# Patient Record
Sex: Female | Born: 1950 | Race: Black or African American | Hispanic: No | Marital: Married | Smoking: Never smoker
Health system: Southern US, Community
[De-identification: ages and names within clinical notes are randomized; demographics above are authoritative.]

## PROBLEM LIST (undated history)

## (undated) DIAGNOSIS — I10 Essential (primary) hypertension: Secondary | ICD-10-CM

## (undated) DIAGNOSIS — H409 Unspecified glaucoma: Secondary | ICD-10-CM

## (undated) HISTORY — PX: TOTAL HIP ARTHROPLASTY: SHX124

---

## 2016-12-09 ENCOUNTER — Emergency Department (HOSPITAL_COMMUNITY): Payer: BLUE CROSS/BLUE SHIELD

## 2016-12-09 ENCOUNTER — Encounter (HOSPITAL_COMMUNITY): Payer: Self-pay | Admitting: *Deleted

## 2016-12-09 ENCOUNTER — Emergency Department (HOSPITAL_COMMUNITY)
Admission: EM | Admit: 2016-12-09 | Discharge: 2016-12-09 | Disposition: A | Discharge status: Home / Self Care | Payer: BLUE CROSS/BLUE SHIELD | Attending: Emergency Medicine | Admitting: Emergency Medicine

## 2016-12-09 DIAGNOSIS — M1712 Unilateral primary osteoarthritis, left knee: Secondary | ICD-10-CM | POA: Insufficient documentation

## 2016-12-09 DIAGNOSIS — I10 Essential (primary) hypertension: Secondary | ICD-10-CM | POA: Diagnosis not present

## 2016-12-09 DIAGNOSIS — M25562 Pain in left knee: Secondary | ICD-10-CM | POA: Diagnosis present

## 2016-12-09 HISTORY — DX: Unspecified glaucoma: H40.9

## 2016-12-09 HISTORY — DX: Essential (primary) hypertension: I10

## 2016-12-09 MED ORDER — TRAMADOL HCL 50 MG PO TABS: 50.0000 mg | ORAL_TABLET | Freq: Four times a day (QID) | ORAL | 0 refills | Status: AC | PRN

## 2016-12-09 MED ORDER — DICLOFENAC SODIUM 75 MG PO TBEC: 75.0000 mg | DELAYED_RELEASE_TABLET | Freq: Two times a day (BID) | ORAL | 0 refills | Status: AC

## 2016-12-09 MED ORDER — KETOROLAC TROMETHAMINE 60 MG/2ML IM SOLN
60.0000 mg | Freq: Once | INTRAMUSCULAR | Status: AC
  Administered 2016-12-09: 60 mg via INTRAMUSCULAR
  Filled 2016-12-09: qty 2

## 2016-12-09 NOTE — Discharge Instructions (Signed)
You do have evidence of arthritis (wear and tear) of your knee joint and knee cap which may be the source of your symptoms.  Rest, try using ice packs to the knee, wrap it for comfort.

## 2016-12-09 NOTE — ED Provider Notes (Signed)
[facility] EMERGENCY DEPARTMENT Provider Note   CSN: [id number] Arrival date & time: 12/09/16  1049     History   Chief Complaint Chief Complaint  Patient presents with  . Knee Pain    HPI Brenda Morton is a 66 y.o. female.  The history is provided by the patient and the spouse.  Knee Pain   This is a new problem. The current episode started yesterday. The problem occurs constantly. The problem has not changed since onset.The pain is present in the left knee. The pain is at a severity of 9/10. The pain is moderate. Associated symptoms include limited range of motion. Pertinent negatives include no numbness and no stiffness. The symptoms are aggravated by activity. She has tried OTC pain medications and OTC ointments for the symptoms. The treatment provided no relief. There has been no history of extremity trauma.    Past Medical History:  Diagnosis Date  . Glaucoma   . Hypertension     There are no active problems to display for this patient.   Past Surgical History:  Procedure Laterality Date  . TOTAL HIP ARTHROPLASTY Right     OB History    No data available       Home Medications    Prior to Admission medications   Medication Sig Start Date End Date Taking? Authorizing Provider  diclofenac (VOLTAREN) 75 MG EC tablet Take 1 tablet (75 mg total) by mouth 2 (two) times daily. 12/09/16   Evalee Jefferson, PA-C  traMADol (ULTRAM) 50 MG tablet Take 1 tablet (50 mg total) by mouth every 6 (six) hours as needed. 12/09/16   Evalee Jefferson, PA-C    Family History No family history on file.  Social History Social History  Substance Use Topics  . Smoking status: Never Smoker  . Smokeless tobacco: Never Used  . Alcohol use No     Allergies   Codeine   Review of Systems Review of Systems  Constitutional: Negative for fever.  Musculoskeletal: Positive for arthralgias and joint swelling. Negative for myalgias and stiffness.  Neurological: Negative for weakness and  numbness.     Physical Exam Updated Vital Signs BP 133/78   Pulse 64   Temp 98.6 F (37 C) (Oral)   Resp 18   Ht 5\' 2"  (1.575 m)   Wt 106.6 kg (235 lb)   SpO2 98%   BMI 42.98 kg/m   Physical Exam  Constitutional: She appears well-developed and well-nourished.  HENT:  Head: Atraumatic.  Neck: Normal range of motion.  Cardiovascular:  Pulses equal bilaterally  Musculoskeletal: She exhibits tenderness.       Left knee: She exhibits swelling. She exhibits no effusion, no deformity, no erythema, normal alignment, no LCL laxity and no MCL laxity. Tenderness found. Medial joint line and lateral joint line tenderness noted.  Mild crepitus with knee flexion.  Neurological: She is alert. She has normal strength. She displays normal reflexes. No sensory deficit.  Skin: Skin is warm and dry.  Psychiatric: She has a normal mood and affect.     ED Treatments / Results  Labs (all labs ordered are listed, but only abnormal results are displayed) Labs Reviewed - No data to display  EKG  EKG Interpretation None       Radiology Dg Knee Complete 4 Views Left  Result Date: 12/09/2016 CLINICAL DATA:  Acute left knee pain without known injury. EXAM: LEFT KNEE - COMPLETE 4+ VIEW COMPARISON:  None. FINDINGS: No evidence of fracture, dislocation, or joint  effusion. Mild osteophyte formation is noted medially and laterally. Spurring of superior aspect of patella is noted. Soft tissues are unremarkable. IMPRESSION: Mild degenerative joint disease. No acute abnormality seen in the left knee. Electronically Signed   By: Marijo Conception, M.D.   On: 12/09/2016 11:28    Procedures Procedures (including critical care time)  Medications Ordered in ED Medications - No data to display   Initial Impression / Assessment and Plan / ED Course  I have reviewed the triage vital signs and the nursing notes.  Pertinent labs & imaging results that were available during my care of the patient were  reviewed by me and considered in my medical decision making (see chart for details).     Patient with osteoarthritis of left knee which is mild per imaging, cannot rule out meniscal injury given abruptness of symptom onset.  She has seen Dr. Case in the past, has a right total knee replacement by Dr. Case approximately 3 years ago.  She is referred back to him for follow-up care.  She was placed in an Ace wrap, discussed ice, anti-inflammatories activity as tolerated.  Tramadol prescribed if needed for additional pain relief.  Final Clinical Impressions(s) / ED Diagnoses   Final diagnoses:  Primary osteoarthritis of left knee    New Prescriptions New Prescriptions   DICLOFENAC (VOLTAREN) 75 MG EC TABLET    Take 1 tablet (75 mg total) by mouth 2 (two) times daily.   TRAMADOL (ULTRAM) 50 MG TABLET    Take 1 tablet (50 mg total) by mouth every 6 (six) hours as needed.     Evalee Jefferson, PA-C 12/09/16 1143    Milton Ferguson, MD 12/09/16 [phone]

## 2016-12-09 NOTE — ED Triage Notes (Addendum)
Pt c/o pain in left knee while ambulating that started yesterday. Denies injury. Pt has used Morley Kos On creme with no relief.

## 2016-12-31 DIAGNOSIS — Z6831 Body mass index (BMI) 31.0-31.9, adult: Secondary | ICD-10-CM | POA: Diagnosis not present

## 2016-12-31 DIAGNOSIS — M25562 Pain in left knee: Secondary | ICD-10-CM | POA: Diagnosis not present

## 2017-02-15 DIAGNOSIS — Z6835 Body mass index (BMI) 35.0-35.9, adult: Secondary | ICD-10-CM | POA: Diagnosis not present

## 2017-02-15 DIAGNOSIS — I10 Essential (primary) hypertension: Secondary | ICD-10-CM | POA: Diagnosis not present

## 2017-02-15 DIAGNOSIS — E782 Mixed hyperlipidemia: Secondary | ICD-10-CM | POA: Diagnosis not present

## 2017-02-15 DIAGNOSIS — Z23 Encounter for immunization: Secondary | ICD-10-CM | POA: Diagnosis not present

## 2017-02-15 DIAGNOSIS — M1712 Unilateral primary osteoarthritis, left knee: Secondary | ICD-10-CM | POA: Diagnosis not present

## 2017-02-19 DIAGNOSIS — M25562 Pain in left knee: Secondary | ICD-10-CM | POA: Diagnosis not present

## 2017-02-19 DIAGNOSIS — M1712 Unilateral primary osteoarthritis, left knee: Secondary | ICD-10-CM | POA: Diagnosis not present

## 2017-02-19 DIAGNOSIS — Z96651 Presence of right artificial knee joint: Secondary | ICD-10-CM | POA: Diagnosis not present

## 2017-02-19 DIAGNOSIS — Z6827 Body mass index (BMI) 27.0-27.9, adult: Secondary | ICD-10-CM | POA: Diagnosis not present

## 2017-06-03 DIAGNOSIS — E782 Mixed hyperlipidemia: Secondary | ICD-10-CM | POA: Diagnosis not present

## 2017-06-03 DIAGNOSIS — Z6834 Body mass index (BMI) 34.0-34.9, adult: Secondary | ICD-10-CM | POA: Diagnosis not present

## 2017-06-03 DIAGNOSIS — Z23 Encounter for immunization: Secondary | ICD-10-CM | POA: Diagnosis not present

## 2017-06-03 DIAGNOSIS — I10 Essential (primary) hypertension: Secondary | ICD-10-CM | POA: Diagnosis not present

## 2017-06-03 DIAGNOSIS — M1712 Unilateral primary osteoarthritis, left knee: Secondary | ICD-10-CM | POA: Diagnosis not present

## 2017-06-03 DIAGNOSIS — Z1212 Encounter for screening for malignant neoplasm of rectum: Secondary | ICD-10-CM | POA: Diagnosis not present

## 2017-10-14 DIAGNOSIS — E782 Mixed hyperlipidemia: Secondary | ICD-10-CM | POA: Diagnosis not present

## 2017-10-14 DIAGNOSIS — I10 Essential (primary) hypertension: Secondary | ICD-10-CM | POA: Diagnosis not present

## 2017-10-14 DIAGNOSIS — M1712 Unilateral primary osteoarthritis, left knee: Secondary | ICD-10-CM | POA: Diagnosis not present

## 2017-10-14 DIAGNOSIS — Z6833 Body mass index (BMI) 33.0-33.9, adult: Secondary | ICD-10-CM | POA: Diagnosis not present

## 2017-10-14 DIAGNOSIS — Z23 Encounter for immunization: Secondary | ICD-10-CM | POA: Diagnosis not present

## 2017-10-14 DIAGNOSIS — Z0001 Encounter for general adult medical examination with abnormal findings: Secondary | ICD-10-CM | POA: Diagnosis not present

## 2017-11-04 DIAGNOSIS — N19 Unspecified kidney failure: Secondary | ICD-10-CM | POA: Diagnosis not present

## 2018-03-31 DIAGNOSIS — E6609 Other obesity due to excess calories: Secondary | ICD-10-CM | POA: Diagnosis not present

## 2018-03-31 DIAGNOSIS — I10 Essential (primary) hypertension: Secondary | ICD-10-CM | POA: Diagnosis not present

## 2018-03-31 DIAGNOSIS — E782 Mixed hyperlipidemia: Secondary | ICD-10-CM | POA: Diagnosis not present

## 2018-03-31 DIAGNOSIS — Z6832 Body mass index (BMI) 32.0-32.9, adult: Secondary | ICD-10-CM | POA: Diagnosis not present

## 2018-03-31 DIAGNOSIS — M1712 Unilateral primary osteoarthritis, left knee: Secondary | ICD-10-CM | POA: Diagnosis not present

## 2018-07-10 DIAGNOSIS — Z1231 Encounter for screening mammogram for malignant neoplasm of breast: Secondary | ICD-10-CM | POA: Diagnosis not present

## 2018-10-13 DIAGNOSIS — E782 Mixed hyperlipidemia: Secondary | ICD-10-CM | POA: Diagnosis not present

## 2018-10-13 DIAGNOSIS — I10 Essential (primary) hypertension: Secondary | ICD-10-CM | POA: Diagnosis not present

## 2018-10-20 DIAGNOSIS — E782 Mixed hyperlipidemia: Secondary | ICD-10-CM | POA: Diagnosis not present

## 2018-10-20 DIAGNOSIS — Z6832 Body mass index (BMI) 32.0-32.9, adult: Secondary | ICD-10-CM | POA: Diagnosis not present

## 2018-10-20 DIAGNOSIS — Z23 Encounter for immunization: Secondary | ICD-10-CM | POA: Diagnosis not present

## 2018-10-20 DIAGNOSIS — Z1212 Encounter for screening for malignant neoplasm of rectum: Secondary | ICD-10-CM | POA: Diagnosis not present

## 2018-10-20 DIAGNOSIS — I10 Essential (primary) hypertension: Secondary | ICD-10-CM | POA: Diagnosis not present

## 2018-10-20 DIAGNOSIS — Z0001 Encounter for general adult medical examination with abnormal findings: Secondary | ICD-10-CM | POA: Diagnosis not present

## 2018-10-20 DIAGNOSIS — E6609 Other obesity due to excess calories: Secondary | ICD-10-CM | POA: Diagnosis not present

## 2018-10-20 DIAGNOSIS — M1712 Unilateral primary osteoarthritis, left knee: Secondary | ICD-10-CM | POA: Diagnosis not present

## 2018-12-16 ENCOUNTER — Other Ambulatory Visit: Payer: Self-pay

## 2018-12-16 NOTE — Patient Outreach (Signed)
[facility]) Care Management  12/16/2018  Brenda Morton 1951/01/29 [id number]   Medication Adherence call to [name] spoke with patient she is past due on Pravastatin 40 mg,patient explain she takes 1 tablet every night at bedtime and has pick up from the pharmacy and has enough for a month.[name] is showing past due under [name].   [place] Management Direct Dial [phone]  Fax [phone] [email or web address]

## 2018-12-21 DIAGNOSIS — M810 Age-related osteoporosis without current pathological fracture: Secondary | ICD-10-CM | POA: Diagnosis not present

## 2018-12-21 DIAGNOSIS — M85852 Other specified disorders of bone density and structure, left thigh: Secondary | ICD-10-CM | POA: Diagnosis not present

## 2018-12-21 DIAGNOSIS — M8588 Other specified disorders of bone density and structure, other site: Secondary | ICD-10-CM | POA: Diagnosis not present

## 2018-12-24 ENCOUNTER — Other Ambulatory Visit: Payer: Self-pay

## 2018-12-24 NOTE — Patient Outreach (Signed)
[facility]) Care Management  12/24/2018  Jaidy Pofahl 1950/06/13 [id number]   Medication Adherence call to Mrs. Dawn Compliant Voice message left with a call back number. Mrs. Sebastiano is showing past due on Pravastatin 40 mg under [place].   [place] Management Direct Dial [phone]  Fax [phone] [email or web address]

## 2019-07-05 DIAGNOSIS — I10 Essential (primary) hypertension: Secondary | ICD-10-CM | POA: Diagnosis not present

## 2019-07-05 DIAGNOSIS — Z9049 Acquired absence of other specified parts of digestive tract: Secondary | ICD-10-CM | POA: Diagnosis not present

## 2019-07-05 DIAGNOSIS — Z96651 Presence of right artificial knee joint: Secondary | ICD-10-CM | POA: Diagnosis not present

## 2019-07-05 DIAGNOSIS — E785 Hyperlipidemia, unspecified: Secondary | ICD-10-CM | POA: Diagnosis not present

## 2019-07-05 DIAGNOSIS — H409 Unspecified glaucoma: Secondary | ICD-10-CM | POA: Diagnosis not present

## 2019-07-05 DIAGNOSIS — Z6829 Body mass index (BMI) 29.0-29.9, adult: Secondary | ICD-10-CM | POA: Diagnosis not present

## 2019-07-06 DIAGNOSIS — R739 Hyperglycemia, unspecified: Secondary | ICD-10-CM | POA: Diagnosis not present

## 2019-07-06 DIAGNOSIS — I10 Essential (primary) hypertension: Secondary | ICD-10-CM | POA: Diagnosis not present

## 2019-07-06 DIAGNOSIS — E782 Mixed hyperlipidemia: Secondary | ICD-10-CM | POA: Diagnosis not present

## 2019-07-12 DIAGNOSIS — M1712 Unilateral primary osteoarthritis, left knee: Secondary | ICD-10-CM | POA: Diagnosis not present

## 2019-07-12 DIAGNOSIS — Z6831 Body mass index (BMI) 31.0-31.9, adult: Secondary | ICD-10-CM | POA: Diagnosis not present

## 2019-07-12 DIAGNOSIS — I10 Essential (primary) hypertension: Secondary | ICD-10-CM | POA: Diagnosis not present

## 2019-07-12 DIAGNOSIS — E782 Mixed hyperlipidemia: Secondary | ICD-10-CM | POA: Diagnosis not present

## 2019-08-01 IMAGING — DX DG KNEE COMPLETE 4+V*L*
4 series · 4 of 4 positions shown · non-contrast
Comparison: None.

CLINICAL DATA: Acute left knee pain without known injury.

EXAM:
LEFT KNEE - COMPLETE 4+ VIEW

[knee ap]
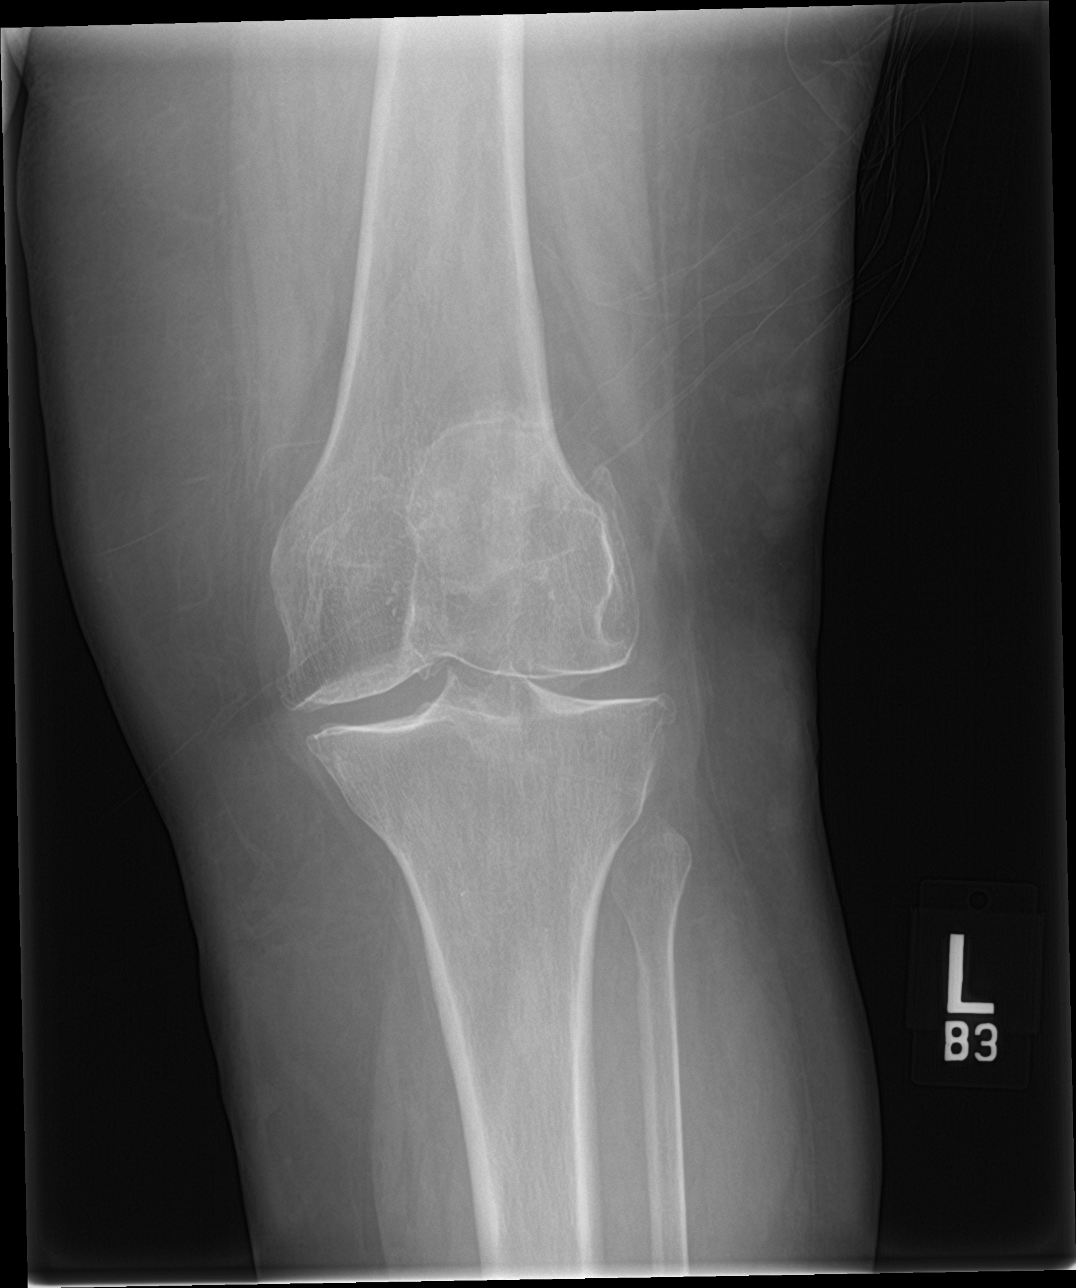

[tunnel]
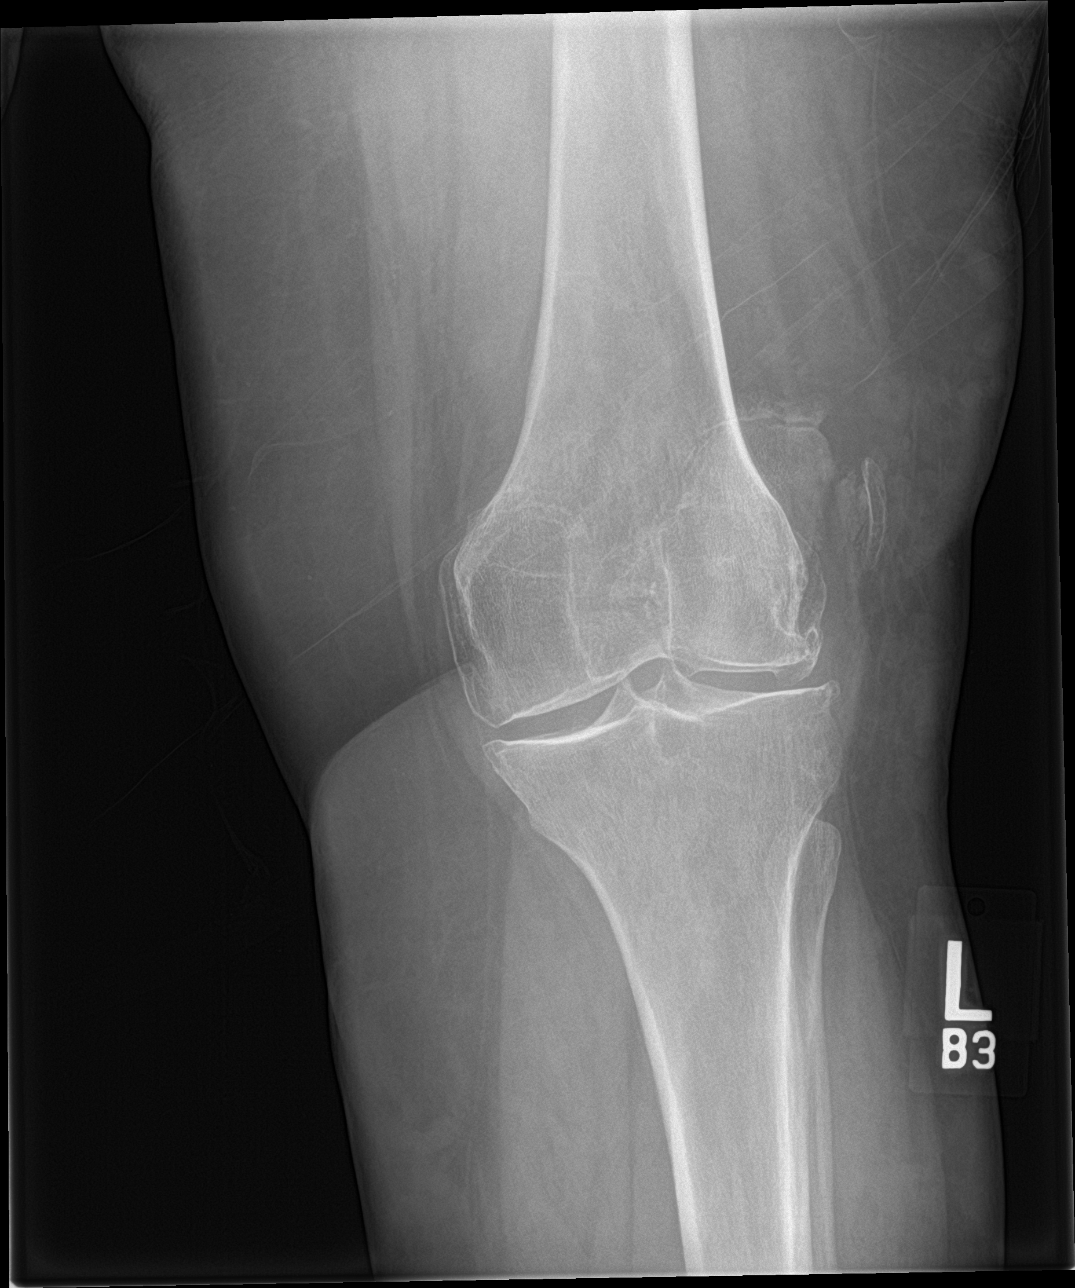

[knee lat]
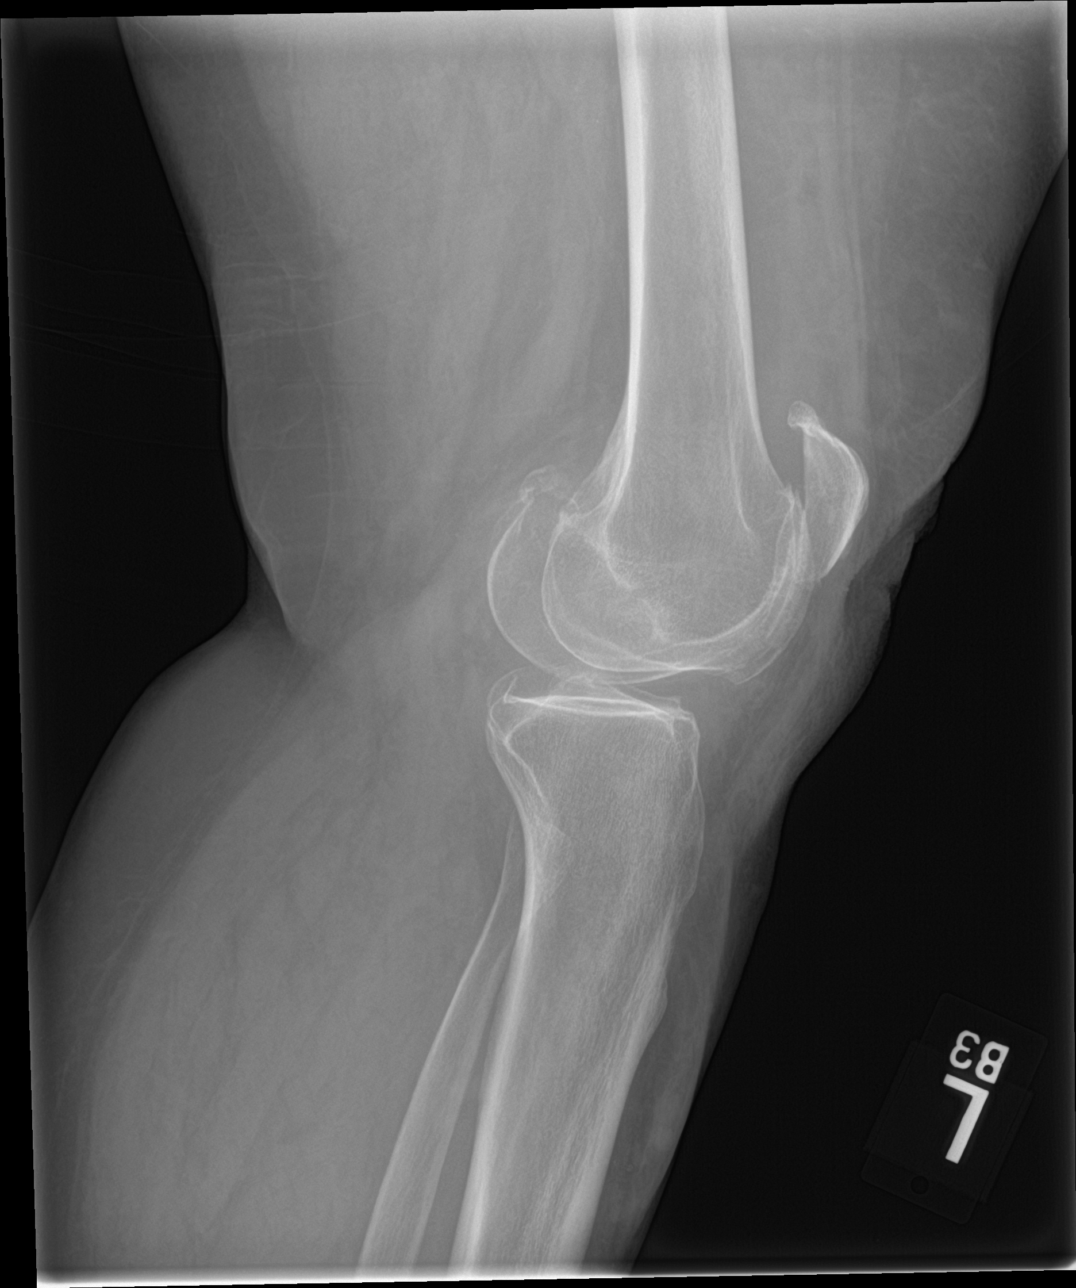

[knee sunrise]
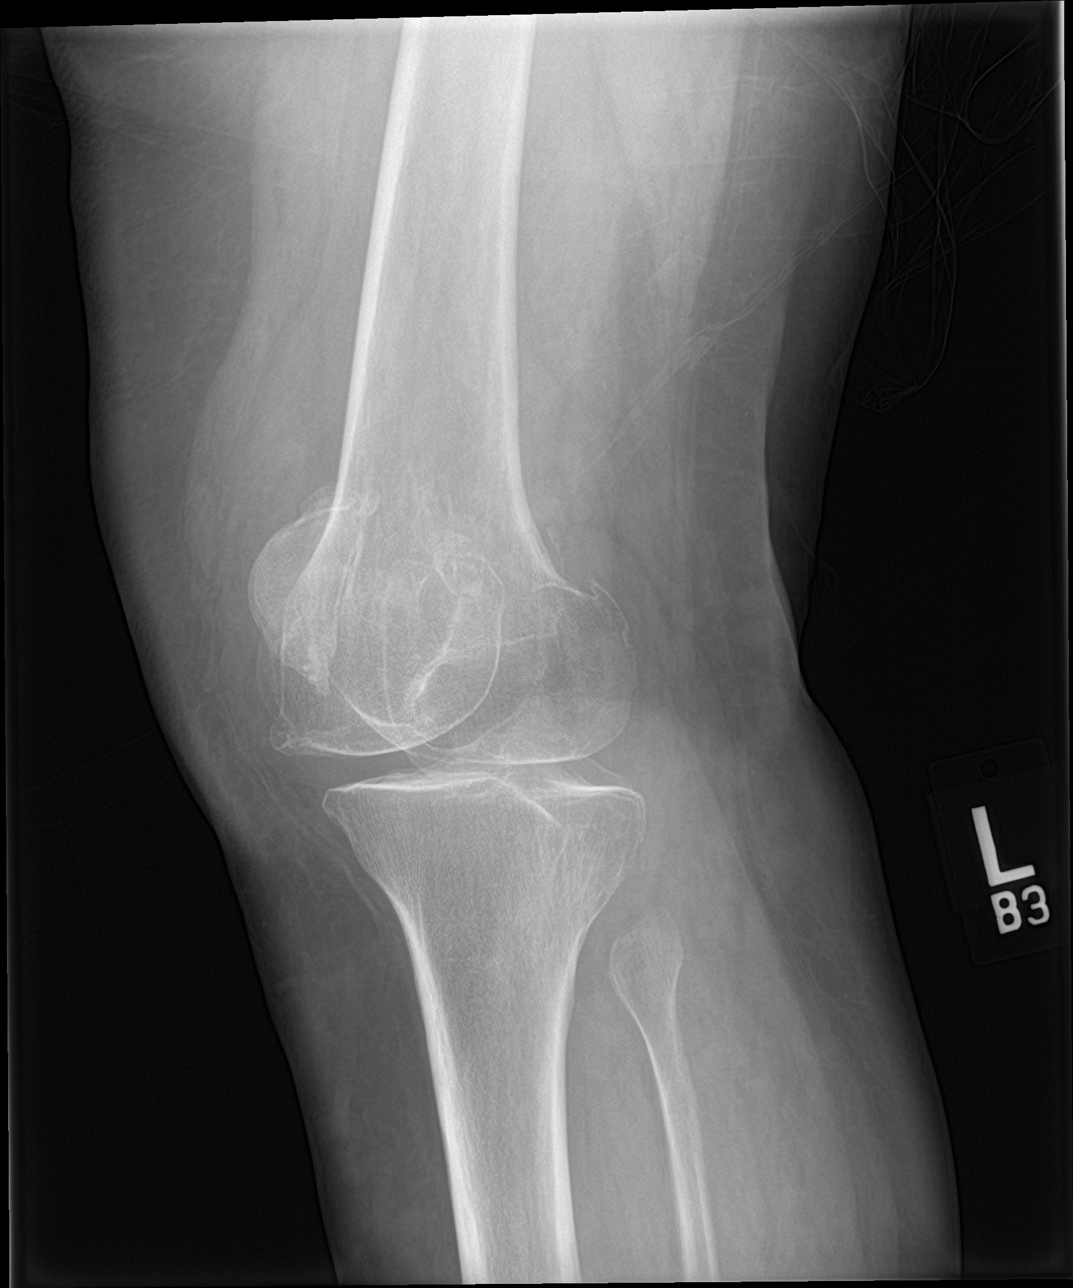

[4 of 4 positions shown; findings below may reference images not displayed]

FINDINGS: No evidence of fracture, dislocation, or joint effusion. Mild
osteophyte formation is noted medially and laterally. Spurring of
superior aspect of patella is noted. Soft tissues are unremarkable.
IMPRESSION: Mild degenerative joint disease. No acute abnormality seen in the
left knee.

## 2019-08-16 DIAGNOSIS — H5210 Myopia, unspecified eye: Secondary | ICD-10-CM | POA: Diagnosis not present

## 2019-08-16 DIAGNOSIS — Z0100 Encounter for examination of eyes and vision without abnormal findings: Secondary | ICD-10-CM | POA: Diagnosis not present

## 2019-08-16 DIAGNOSIS — H401131 Primary open-angle glaucoma, bilateral, mild stage: Secondary | ICD-10-CM | POA: Diagnosis not present

## 2019-08-16 DIAGNOSIS — H25813 Combined forms of age-related cataract, bilateral: Secondary | ICD-10-CM | POA: Diagnosis not present

## 2019-10-19 DIAGNOSIS — Z1231 Encounter for screening mammogram for malignant neoplasm of breast: Secondary | ICD-10-CM | POA: Diagnosis not present

## 2019-11-22 DIAGNOSIS — R739 Hyperglycemia, unspecified: Secondary | ICD-10-CM | POA: Diagnosis not present

## 2019-11-22 DIAGNOSIS — I10 Essential (primary) hypertension: Secondary | ICD-10-CM | POA: Diagnosis not present

## 2019-11-22 DIAGNOSIS — E782 Mixed hyperlipidemia: Secondary | ICD-10-CM | POA: Diagnosis not present

## 2019-11-22 DIAGNOSIS — Z1329 Encounter for screening for other suspected endocrine disorder: Secondary | ICD-10-CM | POA: Diagnosis not present

## 2019-11-22 DIAGNOSIS — Z0001 Encounter for general adult medical examination with abnormal findings: Secondary | ICD-10-CM | POA: Diagnosis not present

## 2019-11-22 DIAGNOSIS — N19 Unspecified kidney failure: Secondary | ICD-10-CM | POA: Diagnosis not present

## 2019-11-29 DIAGNOSIS — Z0001 Encounter for general adult medical examination with abnormal findings: Secondary | ICD-10-CM | POA: Diagnosis not present

## 2019-11-29 DIAGNOSIS — I10 Essential (primary) hypertension: Secondary | ICD-10-CM | POA: Diagnosis not present

## 2019-11-29 DIAGNOSIS — Z23 Encounter for immunization: Secondary | ICD-10-CM | POA: Diagnosis not present

## 2019-11-29 DIAGNOSIS — E782 Mixed hyperlipidemia: Secondary | ICD-10-CM | POA: Diagnosis not present

## 2019-11-29 DIAGNOSIS — Z1212 Encounter for screening for malignant neoplasm of rectum: Secondary | ICD-10-CM | POA: Diagnosis not present

## 2019-11-29 DIAGNOSIS — M1712 Unilateral primary osteoarthritis, left knee: Secondary | ICD-10-CM | POA: Diagnosis not present

## 2020-06-05 DIAGNOSIS — Z23 Encounter for immunization: Secondary | ICD-10-CM | POA: Diagnosis not present

## 2020-06-27 DIAGNOSIS — I10 Essential (primary) hypertension: Secondary | ICD-10-CM | POA: Diagnosis not present

## 2020-06-27 DIAGNOSIS — E7849 Other hyperlipidemia: Secondary | ICD-10-CM | POA: Diagnosis not present

## 2020-06-27 DIAGNOSIS — E782 Mixed hyperlipidemia: Secondary | ICD-10-CM | POA: Diagnosis not present

## 2020-06-27 DIAGNOSIS — R739 Hyperglycemia, unspecified: Secondary | ICD-10-CM | POA: Diagnosis not present

## 2020-07-10 DIAGNOSIS — M1712 Unilateral primary osteoarthritis, left knee: Secondary | ICD-10-CM | POA: Diagnosis not present

## 2020-07-10 DIAGNOSIS — I10 Essential (primary) hypertension: Secondary | ICD-10-CM | POA: Diagnosis not present

## 2020-07-10 DIAGNOSIS — E7849 Other hyperlipidemia: Secondary | ICD-10-CM | POA: Diagnosis not present

## 2020-07-10 DIAGNOSIS — Z6831 Body mass index (BMI) 31.0-31.9, adult: Secondary | ICD-10-CM | POA: Diagnosis not present

## 2020-08-10 DIAGNOSIS — Z6832 Body mass index (BMI) 32.0-32.9, adult: Secondary | ICD-10-CM | POA: Diagnosis not present

## 2020-08-10 DIAGNOSIS — M79672 Pain in left foot: Secondary | ICD-10-CM | POA: Diagnosis not present

## 2020-08-28 DIAGNOSIS — H25813 Combined forms of age-related cataract, bilateral: Secondary | ICD-10-CM | POA: Diagnosis not present

## 2020-08-28 DIAGNOSIS — Z0100 Encounter for examination of eyes and vision without abnormal findings: Secondary | ICD-10-CM | POA: Diagnosis not present

## 2020-08-28 DIAGNOSIS — H5212 Myopia, left eye: Secondary | ICD-10-CM | POA: Diagnosis not present

## 2020-08-28 DIAGNOSIS — H401131 Primary open-angle glaucoma, bilateral, mild stage: Secondary | ICD-10-CM | POA: Diagnosis not present

## 2020-08-28 DIAGNOSIS — H52222 Regular astigmatism, left eye: Secondary | ICD-10-CM | POA: Diagnosis not present

## 2020-08-28 DIAGNOSIS — H524 Presbyopia: Secondary | ICD-10-CM | POA: Diagnosis not present

## 2020-12-05 DIAGNOSIS — Z1159 Encounter for screening for other viral diseases: Secondary | ICD-10-CM | POA: Diagnosis not present

## 2020-12-05 DIAGNOSIS — Z1329 Encounter for screening for other suspected endocrine disorder: Secondary | ICD-10-CM | POA: Diagnosis not present

## 2020-12-05 DIAGNOSIS — Z6831 Body mass index (BMI) 31.0-31.9, adult: Secondary | ICD-10-CM | POA: Diagnosis not present

## 2020-12-05 DIAGNOSIS — Z0001 Encounter for general adult medical examination with abnormal findings: Secondary | ICD-10-CM | POA: Diagnosis not present

## 2020-12-05 DIAGNOSIS — E7849 Other hyperlipidemia: Secondary | ICD-10-CM | POA: Diagnosis not present

## 2020-12-05 DIAGNOSIS — E782 Mixed hyperlipidemia: Secondary | ICD-10-CM | POA: Diagnosis not present

## 2020-12-05 DIAGNOSIS — I10 Essential (primary) hypertension: Secondary | ICD-10-CM | POA: Diagnosis not present

## 2020-12-05 DIAGNOSIS — F17210 Nicotine dependence, cigarettes, uncomplicated: Secondary | ICD-10-CM | POA: Diagnosis not present

## 2020-12-05 DIAGNOSIS — E6609 Other obesity due to excess calories: Secondary | ICD-10-CM | POA: Diagnosis not present

## 2020-12-05 DIAGNOSIS — R739 Hyperglycemia, unspecified: Secondary | ICD-10-CM | POA: Diagnosis not present

## 2020-12-05 DIAGNOSIS — N19 Unspecified kidney failure: Secondary | ICD-10-CM | POA: Diagnosis not present

## 2020-12-11 DIAGNOSIS — Z1212 Encounter for screening for malignant neoplasm of rectum: Secondary | ICD-10-CM | POA: Diagnosis not present

## 2020-12-11 DIAGNOSIS — E7849 Other hyperlipidemia: Secondary | ICD-10-CM | POA: Diagnosis not present

## 2020-12-11 DIAGNOSIS — Z0001 Encounter for general adult medical examination with abnormal findings: Secondary | ICD-10-CM | POA: Diagnosis not present

## 2020-12-11 DIAGNOSIS — I10 Essential (primary) hypertension: Secondary | ICD-10-CM | POA: Diagnosis not present

## 2020-12-11 DIAGNOSIS — E6609 Other obesity due to excess calories: Secondary | ICD-10-CM | POA: Diagnosis not present

## 2020-12-11 DIAGNOSIS — Z23 Encounter for immunization: Secondary | ICD-10-CM | POA: Diagnosis not present

## 2020-12-11 DIAGNOSIS — Z6831 Body mass index (BMI) 31.0-31.9, adult: Secondary | ICD-10-CM | POA: Diagnosis not present

## 2020-12-11 DIAGNOSIS — M1712 Unilateral primary osteoarthritis, left knee: Secondary | ICD-10-CM | POA: Diagnosis not present

## 2021-01-04 DIAGNOSIS — Z1231 Encounter for screening mammogram for malignant neoplasm of breast: Secondary | ICD-10-CM | POA: Diagnosis not present

## 2021-05-28 DIAGNOSIS — I10 Essential (primary) hypertension: Secondary | ICD-10-CM | POA: Diagnosis not present

## 2021-05-28 DIAGNOSIS — E6609 Other obesity due to excess calories: Secondary | ICD-10-CM | POA: Diagnosis not present

## 2021-05-28 DIAGNOSIS — M1712 Unilateral primary osteoarthritis, left knee: Secondary | ICD-10-CM | POA: Diagnosis not present

## 2021-05-28 DIAGNOSIS — Z6833 Body mass index (BMI) 33.0-33.9, adult: Secondary | ICD-10-CM | POA: Diagnosis not present

## 2021-05-28 DIAGNOSIS — E7849 Other hyperlipidemia: Secondary | ICD-10-CM | POA: Diagnosis not present

## 2021-07-04 DIAGNOSIS — I700 Atherosclerosis of aorta: Secondary | ICD-10-CM | POA: Diagnosis not present

## 2021-07-04 DIAGNOSIS — K439 Ventral hernia without obstruction or gangrene: Secondary | ICD-10-CM | POA: Diagnosis not present

## 2021-07-04 DIAGNOSIS — R109 Unspecified abdominal pain: Secondary | ICD-10-CM | POA: Diagnosis not present

## 2021-07-04 DIAGNOSIS — K5730 Diverticulosis of large intestine without perforation or abscess without bleeding: Secondary | ICD-10-CM | POA: Diagnosis not present

## 2021-07-04 DIAGNOSIS — R079 Chest pain, unspecified: Secondary | ICD-10-CM | POA: Diagnosis not present

## 2021-07-04 DIAGNOSIS — R110 Nausea: Secondary | ICD-10-CM | POA: Diagnosis not present

## 2021-07-04 DIAGNOSIS — K429 Umbilical hernia without obstruction or gangrene: Secondary | ICD-10-CM | POA: Diagnosis not present

## 2021-07-04 DIAGNOSIS — M545 Low back pain, unspecified: Secondary | ICD-10-CM | POA: Diagnosis not present

## 2021-07-04 DIAGNOSIS — E785 Hyperlipidemia, unspecified: Secondary | ICD-10-CM | POA: Diagnosis not present

## 2021-07-04 DIAGNOSIS — D259 Leiomyoma of uterus, unspecified: Secondary | ICD-10-CM | POA: Diagnosis not present

## 2021-07-04 DIAGNOSIS — I10 Essential (primary) hypertension: Secondary | ICD-10-CM | POA: Diagnosis not present

## 2021-08-20 DIAGNOSIS — M546 Pain in thoracic spine: Secondary | ICD-10-CM | POA: Diagnosis not present

## 2021-08-20 DIAGNOSIS — Z6833 Body mass index (BMI) 33.0-33.9, adult: Secondary | ICD-10-CM | POA: Diagnosis not present

## 2021-08-30 DIAGNOSIS — R42 Dizziness and giddiness: Secondary | ICD-10-CM | POA: Diagnosis not present

## 2021-08-30 DIAGNOSIS — Z6833 Body mass index (BMI) 33.0-33.9, adult: Secondary | ICD-10-CM | POA: Diagnosis not present

## 2021-08-30 DIAGNOSIS — R110 Nausea: Secondary | ICD-10-CM | POA: Diagnosis not present

## 2021-08-30 DIAGNOSIS — M546 Pain in thoracic spine: Secondary | ICD-10-CM | POA: Diagnosis not present

## 2021-09-03 DIAGNOSIS — H401131 Primary open-angle glaucoma, bilateral, mild stage: Secondary | ICD-10-CM | POA: Diagnosis not present

## 2021-09-03 DIAGNOSIS — H25813 Combined forms of age-related cataract, bilateral: Secondary | ICD-10-CM | POA: Diagnosis not present

## 2021-09-10 DIAGNOSIS — M9901 Segmental and somatic dysfunction of cervical region: Secondary | ICD-10-CM | POA: Diagnosis not present

## 2021-09-10 DIAGNOSIS — S233XXA Sprain of ligaments of thoracic spine, initial encounter: Secondary | ICD-10-CM | POA: Diagnosis not present

## 2021-09-10 DIAGNOSIS — S338XXA Sprain of other parts of lumbar spine and pelvis, initial encounter: Secondary | ICD-10-CM | POA: Diagnosis not present

## 2021-09-10 DIAGNOSIS — M9902 Segmental and somatic dysfunction of thoracic region: Secondary | ICD-10-CM | POA: Diagnosis not present

## 2021-09-10 DIAGNOSIS — S134XXA Sprain of ligaments of cervical spine, initial encounter: Secondary | ICD-10-CM | POA: Diagnosis not present

## 2021-09-10 DIAGNOSIS — M9903 Segmental and somatic dysfunction of lumbar region: Secondary | ICD-10-CM | POA: Diagnosis not present

## 2021-09-17 DIAGNOSIS — M9901 Segmental and somatic dysfunction of cervical region: Secondary | ICD-10-CM | POA: Diagnosis not present

## 2021-09-17 DIAGNOSIS — S233XXA Sprain of ligaments of thoracic spine, initial encounter: Secondary | ICD-10-CM | POA: Diagnosis not present

## 2021-09-17 DIAGNOSIS — S134XXA Sprain of ligaments of cervical spine, initial encounter: Secondary | ICD-10-CM | POA: Diagnosis not present

## 2021-09-17 DIAGNOSIS — M9903 Segmental and somatic dysfunction of lumbar region: Secondary | ICD-10-CM | POA: Diagnosis not present

## 2021-09-17 DIAGNOSIS — M9902 Segmental and somatic dysfunction of thoracic region: Secondary | ICD-10-CM | POA: Diagnosis not present

## 2021-09-17 DIAGNOSIS — S338XXA Sprain of other parts of lumbar spine and pelvis, initial encounter: Secondary | ICD-10-CM | POA: Diagnosis not present

## 2021-10-01 DIAGNOSIS — M9901 Segmental and somatic dysfunction of cervical region: Secondary | ICD-10-CM | POA: Diagnosis not present

## 2021-10-01 DIAGNOSIS — M9903 Segmental and somatic dysfunction of lumbar region: Secondary | ICD-10-CM | POA: Diagnosis not present

## 2021-10-01 DIAGNOSIS — S233XXA Sprain of ligaments of thoracic spine, initial encounter: Secondary | ICD-10-CM | POA: Diagnosis not present

## 2021-10-01 DIAGNOSIS — S134XXA Sprain of ligaments of cervical spine, initial encounter: Secondary | ICD-10-CM | POA: Diagnosis not present

## 2021-10-01 DIAGNOSIS — S338XXA Sprain of other parts of lumbar spine and pelvis, initial encounter: Secondary | ICD-10-CM | POA: Diagnosis not present

## 2021-10-01 DIAGNOSIS — M9902 Segmental and somatic dysfunction of thoracic region: Secondary | ICD-10-CM | POA: Diagnosis not present

## 2021-11-05 DIAGNOSIS — M9902 Segmental and somatic dysfunction of thoracic region: Secondary | ICD-10-CM | POA: Diagnosis not present

## 2021-11-05 DIAGNOSIS — S338XXA Sprain of other parts of lumbar spine and pelvis, initial encounter: Secondary | ICD-10-CM | POA: Diagnosis not present

## 2021-11-05 DIAGNOSIS — M9903 Segmental and somatic dysfunction of lumbar region: Secondary | ICD-10-CM | POA: Diagnosis not present

## 2021-11-05 DIAGNOSIS — M9901 Segmental and somatic dysfunction of cervical region: Secondary | ICD-10-CM | POA: Diagnosis not present

## 2021-11-05 DIAGNOSIS — S233XXA Sprain of ligaments of thoracic spine, initial encounter: Secondary | ICD-10-CM | POA: Diagnosis not present

## 2021-11-05 DIAGNOSIS — S134XXA Sprain of ligaments of cervical spine, initial encounter: Secondary | ICD-10-CM | POA: Diagnosis not present

## 2021-11-19 DIAGNOSIS — H25813 Combined forms of age-related cataract, bilateral: Secondary | ICD-10-CM | POA: Diagnosis not present

## 2021-11-19 DIAGNOSIS — H01002 Unspecified blepharitis right lower eyelid: Secondary | ICD-10-CM | POA: Diagnosis not present

## 2021-11-19 DIAGNOSIS — H401113 Primary open-angle glaucoma, right eye, severe stage: Secondary | ICD-10-CM | POA: Diagnosis not present

## 2021-11-19 DIAGNOSIS — H01001 Unspecified blepharitis right upper eyelid: Secondary | ICD-10-CM | POA: Diagnosis not present

## 2021-11-19 DIAGNOSIS — H401122 Primary open-angle glaucoma, left eye, moderate stage: Secondary | ICD-10-CM | POA: Diagnosis not present

## 2021-11-19 DIAGNOSIS — H01004 Unspecified blepharitis left upper eyelid: Secondary | ICD-10-CM | POA: Diagnosis not present

## 2021-11-22 DIAGNOSIS — E039 Hypothyroidism, unspecified: Secondary | ICD-10-CM | POA: Diagnosis not present

## 2021-11-22 DIAGNOSIS — N19 Unspecified kidney failure: Secondary | ICD-10-CM | POA: Diagnosis not present

## 2021-11-22 DIAGNOSIS — Z0001 Encounter for general adult medical examination with abnormal findings: Secondary | ICD-10-CM | POA: Diagnosis not present

## 2021-11-22 DIAGNOSIS — E782 Mixed hyperlipidemia: Secondary | ICD-10-CM | POA: Diagnosis not present

## 2021-11-22 DIAGNOSIS — R739 Hyperglycemia, unspecified: Secondary | ICD-10-CM | POA: Diagnosis not present

## 2021-11-22 DIAGNOSIS — I10 Essential (primary) hypertension: Secondary | ICD-10-CM | POA: Diagnosis not present

## 2021-11-26 DIAGNOSIS — I10 Essential (primary) hypertension: Secondary | ICD-10-CM | POA: Diagnosis not present

## 2021-11-26 DIAGNOSIS — E6609 Other obesity due to excess calories: Secondary | ICD-10-CM | POA: Diagnosis not present

## 2021-11-26 DIAGNOSIS — Z0001 Encounter for general adult medical examination with abnormal findings: Secondary | ICD-10-CM | POA: Diagnosis not present

## 2021-11-26 DIAGNOSIS — Z6833 Body mass index (BMI) 33.0-33.9, adult: Secondary | ICD-10-CM | POA: Diagnosis not present

## 2021-11-26 DIAGNOSIS — E7849 Other hyperlipidemia: Secondary | ICD-10-CM | POA: Diagnosis not present

## 2021-11-26 DIAGNOSIS — Z23 Encounter for immunization: Secondary | ICD-10-CM | POA: Diagnosis not present

## 2021-11-26 DIAGNOSIS — M1712 Unilateral primary osteoarthritis, left knee: Secondary | ICD-10-CM | POA: Diagnosis not present

## 2021-12-03 DIAGNOSIS — E875 Hyperkalemia: Secondary | ICD-10-CM | POA: Diagnosis not present

## 2021-12-03 DIAGNOSIS — M9902 Segmental and somatic dysfunction of thoracic region: Secondary | ICD-10-CM | POA: Diagnosis not present

## 2021-12-03 DIAGNOSIS — Z23 Encounter for immunization: Secondary | ICD-10-CM | POA: Diagnosis not present

## 2021-12-03 DIAGNOSIS — S338XXA Sprain of other parts of lumbar spine and pelvis, initial encounter: Secondary | ICD-10-CM | POA: Diagnosis not present

## 2021-12-03 DIAGNOSIS — M9901 Segmental and somatic dysfunction of cervical region: Secondary | ICD-10-CM | POA: Diagnosis not present

## 2021-12-03 DIAGNOSIS — S134XXA Sprain of ligaments of cervical spine, initial encounter: Secondary | ICD-10-CM | POA: Diagnosis not present

## 2021-12-03 DIAGNOSIS — M9903 Segmental and somatic dysfunction of lumbar region: Secondary | ICD-10-CM | POA: Diagnosis not present

## 2021-12-03 DIAGNOSIS — S233XXA Sprain of ligaments of thoracic spine, initial encounter: Secondary | ICD-10-CM | POA: Diagnosis not present

## 2021-12-12 NOTE — H&P (Signed)
Surgical History & Physical  Patient Name: Brenda Morton           DOB: [birth year]  Surgery: Cataract extraction with intraocular lens implant phacoemulsification & Goniotomy Treatment (iAccess); Left Eye  Surgeon: Baruch Goldmann MD Surgery Date:  01-04-22 Pre-Op Date:  12-12-21  HPI: A 70 Yr. old female patient is referred by Dr Hassell Done for cataract eval. Using Latanoprost and Timolol OU (did not put der drops this morning). 1. The patient complains of difficulty when seeing street signs, which began many years ago. Both eyes are affected. The episode is gradual. The condition's severity increased since last visit. Symptoms occur when the patient is outside. This is negatively affecting the patient's quality of life and the patient is unable to function adequately in life with the current level of vision. HPI was performed by Baruch Goldmann .  Medical History: Glaucoma Cataracts Amblyopia OD High Blood Pressure LDL  Review of Systems Negative Allergic/Immunologic Negative Cardiovascular Negative Constitutional Negative Ear, Nose, Mouth & Throat Negative Endocrine Negative Eyes Negative Gastrointestinal Negative Genitourinary Negative Hemotologic/Lymphatic Negative Integumentary Negative Musculoskeletal Negative Neurological Negative Psychiatry Negative Respiratory  Social   Never smoked   Medication Latanoprost, Timolol,  Amlodipine, Pravastatin,   Sx/Procedures   None  Drug Allergies  Codeine,   History & Physical: Heent: Cataract & Glaucoma, left eye NECK: supple without bruits LUNGS: lungs clear to auscultation CV: regular rate and rhythm Abdomen: soft and non-tender Impression & Plan: Assessment: 1.  COMBINED FORMS AGE RELATED CATARACT; Both Eyes (H25.813) 2.  BLEPHARITIS; Right Upper Lid, Right Lower Lid, Left Upper Lid, Left Lower Lid (H01.001, H01.002,H01.004,H01.005) 3.  PRIMARY OPEN ANGLE GLAUCOMA; Right Eye Severe, Left Eye Moderate (H40.1113,  G01.7494)  Plan: 1.  Cataract accounts for the patient's decreased vision. This visual impairment is not correctable with a tolerable change in glasses or contact lenses. Cataract surgery with an implantation of a new lens should significantly improve the visual and functional status of the patient. Discussed all risks, benefits, alternatives, and potential complications. Discussed the procedures and recovery. Patient desires to have surgery. A-scan ordered and performed today for intra-ocular lens calculations. The surgery will be performed in order to improve vision for driving, reading, and for eye examinations. Recommend phacoemulsification with intra-ocular lens. Recommend Dextenza for post-operative pain and inflammation. Left Eye. Dilates poorly - shugarcaine by protocol. Malyugin Ring. Omidira. Recommend iAccess to control pressure in setting of glaucoma.  2.  Recommend regular lid cleaning.  3.  Possible history of trabeculectomy in the right eye. IOPs above goal OU. OCT rNFL unreliable OD, thinning OD 11/19/21 Continue timolol QAM OU. Continue Latanoprost 1 drop both eyes every day. Detailed discussion about glaucoma today including importance of maintaining good follow up and following treatment plan, and the possibility of irreversible blindness as part of this disease process. Given elevated IOPs, recommend iAccess goniotomy both eyes at the time of cataract surgery to improve compliance and help lower eye pressure.

## 2021-12-27 DIAGNOSIS — H25812 Combined forms of age-related cataract, left eye: Secondary | ICD-10-CM | POA: Diagnosis not present

## 2022-01-01 ENCOUNTER — Other Ambulatory Visit: Payer: Self-pay

## 2022-01-01 ENCOUNTER — Encounter (HOSPITAL_COMMUNITY): Payer: Self-pay

## 2022-01-01 ENCOUNTER — Encounter (HOSPITAL_COMMUNITY)
Admission: RE | Admit: 2022-01-01 | Discharge: 2022-01-01 | Disposition: A | Discharge status: Home / Self Care | Payer: BLUE CROSS/BLUE SHIELD | Source: Ambulatory Visit | Attending: Ophthalmology | Admitting: Ophthalmology

## 2022-01-04 ENCOUNTER — Encounter (HOSPITAL_COMMUNITY): Payer: Self-pay | Admitting: Ophthalmology

## 2022-01-04 ENCOUNTER — Ambulatory Visit (HOSPITAL_COMMUNITY)
Admission: RE | Admit: 2022-01-04 | Discharge: 2022-01-04 | Disposition: A | Discharge status: Home / Self Care | Payer: Medicare HMO | Attending: Ophthalmology | Admitting: Ophthalmology

## 2022-01-04 ENCOUNTER — Ambulatory Visit (HOSPITAL_COMMUNITY): Payer: Medicare HMO | Admitting: Anesthesiology

## 2022-01-04 ENCOUNTER — Encounter (HOSPITAL_COMMUNITY)
Admission: RE | Disposition: A | Discharge status: Home / Self Care | Payer: Self-pay | Source: Home / Self Care | Attending: Ophthalmology

## 2022-01-04 ENCOUNTER — Ambulatory Visit (HOSPITAL_BASED_OUTPATIENT_CLINIC_OR_DEPARTMENT_OTHER): Payer: Medicare HMO | Admitting: Anesthesiology

## 2022-01-04 DIAGNOSIS — H401122 Primary open-angle glaucoma, left eye, moderate stage: Secondary | ICD-10-CM | POA: Insufficient documentation

## 2022-01-04 DIAGNOSIS — H25812 Combined forms of age-related cataract, left eye: Secondary | ICD-10-CM | POA: Diagnosis not present

## 2022-01-04 DIAGNOSIS — I10 Essential (primary) hypertension: Secondary | ICD-10-CM

## 2022-01-04 DIAGNOSIS — Z6841 Body Mass Index (BMI) 40.0 and over, adult: Secondary | ICD-10-CM | POA: Diagnosis not present

## 2022-01-04 HISTORY — PX: CATARACT EXTRACTION W/PHACO: SHX586

## 2022-01-04 SURGERY — PHACOEMULSIFICATION, CATARACT, WITH IOL INSERTION
Anesthesia: Monitor Anesthesia Care | Site: Eye | Laterality: Left

## 2022-01-04 MED ORDER — PHENYLEPHRINE HCL 2.5 % OP SOLN
1.0000 [drp] | OPHTHALMIC | Status: AC | PRN
  Administered 2022-01-04 (×3): 1 [drp] via OPHTHALMIC

## 2022-01-04 MED ORDER — TETRACAINE HCL 0.5 % OP SOLN
1.0000 [drp] | OPHTHALMIC | Status: AC | PRN
  Administered 2022-01-04 (×3): 1 [drp] via OPHTHALMIC

## 2022-01-04 MED ORDER — EPINEPHRINE PF 1 MG/ML IJ SOLN
INTRAMUSCULAR | Status: AC
  Filled 2022-01-04: qty 1

## 2022-01-04 MED ORDER — MIDAZOLAM HCL 2 MG/2ML IJ SOLN
INTRAMUSCULAR | Status: DC | PRN
  Administered 2022-01-04: 2 mg via INTRAVENOUS

## 2022-01-04 MED ORDER — BSS IO SOLN
INTRAOCULAR | Status: DC | PRN
  Administered 2022-01-04: 15 mL via INTRAOCULAR

## 2022-01-04 MED ORDER — PHENYLEPHRINE-KETOROLAC 1-0.3 % IO SOLN
INTRAOCULAR | Status: AC
  Filled 2022-01-04: qty 4

## 2022-01-04 MED ORDER — LIDOCAINE HCL (PF) 1 % IJ SOLN
INTRAOCULAR | Status: DC | PRN
  Administered 2022-01-04: 1 mL via OPHTHALMIC

## 2022-01-04 MED ORDER — SODIUM HYALURONATE 23MG/ML IO SOSY
PREFILLED_SYRINGE | INTRAOCULAR | Status: DC | PRN
  Administered 2022-01-04: .6 mL via INTRAOCULAR

## 2022-01-04 MED ORDER — STERILE WATER FOR IRRIGATION IR SOLN
Status: DC | PRN
  Administered 2022-01-04: 25 mL

## 2022-01-04 MED ORDER — SODIUM CHLORIDE 0.9% FLUSH
INTRAVENOUS | Status: DC | PRN
  Administered 2022-01-04: 5 mL via INTRAVENOUS

## 2022-01-04 MED ORDER — TROPICAMIDE 1 % OP SOLN
1.0000 [drp] | OPHTHALMIC | Status: AC | PRN
  Administered 2022-01-04 (×3): 1 [drp] via OPHTHALMIC

## 2022-01-04 MED ORDER — LIDOCAINE HCL 3.5 % OP GEL
1.0000 | Freq: Once | OPHTHALMIC | Status: AC
  Administered 2022-01-04: 1 via OPHTHALMIC

## 2022-01-04 MED ORDER — MOXIFLOXACIN HCL 5 MG/ML IO SOLN
INTRAOCULAR | Status: AC
  Filled 2022-01-04: qty 1

## 2022-01-04 MED ORDER — MIDAZOLAM HCL 2 MG/2ML IJ SOLN
INTRAMUSCULAR | Status: AC
  Filled 2022-01-04: qty 2

## 2022-01-04 MED ORDER — MOXIFLOXACIN HCL 0.5 % OP SOLN
OPHTHALMIC | Status: DC | PRN
  Administered 2022-01-04: .3 mL via OPHTHALMIC

## 2022-01-04 MED ORDER — PHENYLEPHRINE-KETOROLAC 1-0.3 % IO SOLN
INTRAOCULAR | Status: DC | PRN
  Administered 2022-01-04: 500 mL via OPHTHALMIC

## 2022-01-04 MED ORDER — POVIDONE-IODINE 5 % OP SOLN
OPHTHALMIC | Status: DC | PRN
  Administered 2022-01-04: 1 via OPHTHALMIC

## 2022-01-04 MED ORDER — SODIUM HYALURONATE 10 MG/ML IO SOLUTION
PREFILLED_SYRINGE | INTRAOCULAR | Status: DC | PRN
  Administered 2022-01-04: .85 mL via INTRAOCULAR

## 2022-01-04 SURGICAL SUPPLY — 15 items
CATARACT SUITE SIGHTPATH (MISCELLANEOUS) ×1 IMPLANT
CLIP IPRISM (KITS) IMPLANT
CLOTH BEACON ORANGE TIMEOUT ST (SAFETY) ×1 IMPLANT
EYE SHIELD UNIVERSAL CLEAR (GAUZE/BANDAGES/DRESSINGS) IMPLANT
FEE CATARACT SUITE SIGHTPATH (MISCELLANEOUS) ×1 IMPLANT
GLOVE BIOGEL PI IND STRL 7.0 (GLOVE) ×2 IMPLANT
LENS IOL RAYNER 18.5 (Intraocular Lens) ×1 IMPLANT
LENS IOL RAYONE EMV 18.5 (Intraocular Lens) IMPLANT
NDL HYPO 18GX1.5 BLUNT FILL (NEEDLE) ×2 IMPLANT
NEEDLE HYPO 18GX1.5 BLUNT FILL (NEEDLE) ×2 IMPLANT
PAD ARMBOARD 7.5X6 YLW CONV (MISCELLANEOUS) ×1 IMPLANT
SYR TB 1ML LL NO SAFETY (SYRINGE) ×1 IMPLANT
TAPE SURG TRANSPORE 1 IN (GAUZE/BANDAGES/DRESSINGS) IMPLANT
TAPE SURGICAL TRANSPORE 1 IN (GAUZE/BANDAGES/DRESSINGS) ×1
TREPHINE GLAUKO IACCESS TRBCLR (OPHTHALMIC) IMPLANT

## 2022-01-04 NOTE — Transfer of Care (Signed)
Immediate Anesthesia Transfer of Care Note  Patient: [facility]  Procedure(s) Performed: CATARACT EXTRACTION PHACO AND INTRAOCULAR LENS PLACEMENT (IOC) (Left: Eye) Goniotomy (Left: Eye)  Patient Location: Short Stay  Anesthesia Type:MAC  Level of Consciousness: awake, alert , oriented, and patient cooperative  Airway & Oxygen Therapy: Patient Spontanous Breathing  Post-op Assessment: Report given to RN, Post -op Vital signs reviewed and stable, and Patient moving all extremities X 4  Post vital signs: Reviewed and stable  Last Vitals:  Vitals Value Taken Time  BP    Temp    Pulse    Resp    SpO2      Last Pain:  Vitals:   01/04/22 1018  TempSrc: Oral  PainSc: 0-No pain         Complications: No notable events documented.

## 2022-01-04 NOTE — Anesthesia Preprocedure Evaluation (Signed)
Anesthesia Evaluation  Patient identified by MRN, date of birth, ID band Patient awake    Reviewed: Allergy & Precautions, H&P , NPO status , Patient's Chart, lab work & pertinent test results, reviewed documented beta blocker date and time   Airway Mallampati: II  TM Distance: >3 FB Neck ROM: full    Dental no notable dental hx.    Pulmonary neg pulmonary ROS   Pulmonary exam normal breath sounds clear to auscultation       Cardiovascular Exercise Tolerance: Good hypertension, negative cardio ROS  Rhythm:regular Rate:Normal     Neuro/Psych negative neurological ROS  negative psych ROS   GI/Hepatic negative GI ROS, Neg liver ROS,,,  Endo/Other    Morbid obesity  Renal/GU negative Renal ROS  negative genitourinary   Musculoskeletal   Abdominal   Peds  Hematology negative hematology ROS (+)   Anesthesia Other Findings   Reproductive/Obstetrics negative OB ROS                             Anesthesia Physical Anesthesia Plan  ASA: 3  Anesthesia Plan: MAC   Post-op Pain Management:    Induction:   PONV Risk Score and Plan:   Airway Management Planned:   Additional Equipment:   Intra-op Plan:   Post-operative Plan:   Informed Consent: I have reviewed the patients History and Physical, chart, labs and discussed the procedure including the risks, benefits and alternatives for the proposed anesthesia with the patient or authorized representative who has indicated his/her understanding and acceptance.     Dental Advisory Given  Plan Discussed with: CRNA  Anesthesia Plan Comments:        Anesthesia Quick Evaluation

## 2022-01-04 NOTE — Discharge Instructions (Addendum)
Please discharge patient when stable, will follow up today with Dr. Wrzosek at the [facility] office immediately following discharge.  Leave shield in place until visit.  All paperwork with discharge instructions will be given at the office.  [facility] Address:  [address]  , [place] [address]  

## 2022-01-04 NOTE — Interval H&P Note (Signed)
History and Physical Interval Note:  01/04/2022 11:20 AM  Brenda Morton  has presented today for surgery, with the diagnosis of combined forms age related cataract; left glaucoma; left.  The various methods of treatment have been discussed with the patient and family. After consideration of risks, benefits and other options for treatment, the patient has consented to  Procedure(s) with comments: CATARACT EXTRACTION PHACO AND INTRAOCULAR LENS PLACEMENT (IOC) (Left) - pt knows to arrive at 10:00 Goniotomy (Left) as a surgical intervention.  The patient's history has been reviewed, patient examined, no change in status, stable for surgery.  I have reviewed the patient's chart and labs.  Questions were answered to the patient's satisfaction.     Baruch Goldmann

## 2022-01-04 NOTE — Op Note (Signed)
Date of procedure: 01/04/22  Pre-operative diagnosis:  1) Visually significant age-related cataract, Left Eye (H25.812) 2) Primary Open Angle Glaucoma, moderate Stage, Left Eye  Post-operative diagnosis: 1) Visually significant age-related cataract, Left Eye 2) Primary Open Angle Glaucoma, mdoerate Stage, Left Eye  Procedure:  1) Removal of cataract via phacoemulsification and insertion of intra-ocular lens Rayner [id number] +18.5D into the capsular bag of the Left Eye 2) Goniotomy treatment of nasal trabecular meshwork with iAccess, Left Eye  Attending surgeon: Gerda Diss. Skila Rollins, MD, MA  Anesthesia: MAC, Topical Akten  Complications: None  Estimated Blood Loss: <42m (minimal)  Specimens: None  Implants: As above  Indications:  Visually significant age-related cataract, Left Eye; Glaucoma, Left Eye  Procedure:  The patient was seen and identified in the pre-operative area. The operative eye was identified and dilated.  The operative eye was marked.  Topical anesthesia was administered to the operative eye.     The patient was then to the operative suite and placed in the supine position.  A timeout was performed confirming the patient, procedure to be performed, and all other relevant information.   The patient's face was prepped and draped in the usual fashion for intra-ocular surgery.  A lid speculum was placed into the operative eye and the surgical microscope moved into place and focused. An inferotemporal paracentesis was created using a 20 gauge paracentesis blade.  Shugarcaine was injected into the anterior chamber.  Viscoelastic was injected into the anterior chamber.  A temporal clear-corneal main wound incision was created using a 2.439mmicrokeratome.  A continuous curvilinear capsulorrhexis was initiated using an irrigating cystitome and completed using capsulorrhexis forceps.  Hydrodissection and hydrodeliniation were performed.  Viscoelastic was injected into the anterior  chamber.  A phacoemulsification handpiece and a chopper as a second instrument were used to remove the nucleus and epinucleus. The irrigation/aspiration handpiece was used to remove any remaining cortical material.   The capsular bag was reinflated with viscoelastic, checked, and found to be intact.  The intraocular lens was inserted into the capsular bag and dialed into place using a Kuglen hook.    The patient's head was repositioned.  The iAccess was used to extensively treat the nasal trabecular meshwork for 90 degrees for a total of 6 goniotomies under gonioscopy.  The patient's head was returned to neutral.  The irrigation/aspiration handpiece was used to remove any remaining viscoelastic.  The clear corneal wound and paracentesis wounds were then hydrated and checked with Weck-Cels to be watertight.  The lid-speculum was removed.  The drape was removed.  The patient's face was cleaned with a wet and dry 4x4.  Moxifloxacin drops were instilled on the eye. A clear shield was taped over the eye. The patient was taken to the post-operative care unit in good condition, having tolerated the procedure well.  Post-Op Instructions: [facility] and will receive all other orders and instructions.

## 2022-01-05 NOTE — Anesthesia Postprocedure Evaluation (Signed)
Anesthesia Post Note  Patient: Brenda Morton  Procedure(s) Performed: CATARACT EXTRACTION PHACO AND INTRAOCULAR LENS PLACEMENT (IOC) (Left: Eye) Goniotomy (Left: Eye)  Patient location during evaluation: Phase II Anesthesia Type: MAC Level of consciousness: awake Pain management: pain level controlled Vital Signs Assessment: post-procedure vital signs reviewed and stable Respiratory status: spontaneous breathing and respiratory function stable Cardiovascular status: blood pressure returned to baseline and stable Postop Assessment: no headache and no apparent nausea or vomiting Anesthetic complications: no Comments: Late entry   No notable events documented.   Last Vitals:  Vitals:   01/04/22 1018 01/04/22 1154  BP: (!) 148/93 (!) 141/78  Pulse: 71 67  Resp: 19 17  Temp: 36.4 C 36.7 C  SpO2: 100% 100%    Last Pain:  Vitals:   01/04/22 1154  TempSrc: Oral  PainSc: 0-No pain                 Louann Sjogren

## 2022-01-14 ENCOUNTER — Encounter (HOSPITAL_COMMUNITY): Payer: Self-pay | Admitting: Ophthalmology

## 2022-01-14 DIAGNOSIS — Z1231 Encounter for screening mammogram for malignant neoplasm of breast: Secondary | ICD-10-CM | POA: Diagnosis not present

## 2022-02-28 DIAGNOSIS — H524 Presbyopia: Secondary | ICD-10-CM | POA: Diagnosis not present

## 2022-05-06 DIAGNOSIS — H401122 Primary open-angle glaucoma, left eye, moderate stage: Secondary | ICD-10-CM | POA: Diagnosis not present

## 2022-05-06 DIAGNOSIS — H401113 Primary open-angle glaucoma, right eye, severe stage: Secondary | ICD-10-CM | POA: Diagnosis not present

## 2022-05-06 DIAGNOSIS — H01001 Unspecified blepharitis right upper eyelid: Secondary | ICD-10-CM | POA: Diagnosis not present

## 2022-05-06 DIAGNOSIS — H25811 Combined forms of age-related cataract, right eye: Secondary | ICD-10-CM | POA: Diagnosis not present

## 2022-05-23 DIAGNOSIS — N19 Unspecified kidney failure: Secondary | ICD-10-CM | POA: Diagnosis not present

## 2022-05-23 DIAGNOSIS — E7849 Other hyperlipidemia: Secondary | ICD-10-CM | POA: Diagnosis not present

## 2022-05-23 DIAGNOSIS — R739 Hyperglycemia, unspecified: Secondary | ICD-10-CM | POA: Diagnosis not present

## 2022-05-23 DIAGNOSIS — Z1329 Encounter for screening for other suspected endocrine disorder: Secondary | ICD-10-CM | POA: Diagnosis not present

## 2022-05-27 DIAGNOSIS — E7849 Other hyperlipidemia: Secondary | ICD-10-CM | POA: Diagnosis not present

## 2022-05-27 DIAGNOSIS — E6609 Other obesity due to excess calories: Secondary | ICD-10-CM | POA: Diagnosis not present

## 2022-05-27 DIAGNOSIS — Z6833 Body mass index (BMI) 33.0-33.9, adult: Secondary | ICD-10-CM | POA: Diagnosis not present

## 2022-05-27 DIAGNOSIS — M1712 Unilateral primary osteoarthritis, left knee: Secondary | ICD-10-CM | POA: Diagnosis not present

## 2022-05-27 DIAGNOSIS — I10 Essential (primary) hypertension: Secondary | ICD-10-CM | POA: Diagnosis not present

## 2022-06-20 DIAGNOSIS — M810 Age-related osteoporosis without current pathological fracture: Secondary | ICD-10-CM | POA: Diagnosis not present

## 2022-06-20 DIAGNOSIS — M8589 Other specified disorders of bone density and structure, multiple sites: Secondary | ICD-10-CM | POA: Diagnosis not present

## 2022-08-05 DIAGNOSIS — H401113 Primary open-angle glaucoma, right eye, severe stage: Secondary | ICD-10-CM | POA: Diagnosis not present

## 2022-08-05 DIAGNOSIS — H25811 Combined forms of age-related cataract, right eye: Secondary | ICD-10-CM | POA: Diagnosis not present

## 2022-08-05 DIAGNOSIS — H401122 Primary open-angle glaucoma, left eye, moderate stage: Secondary | ICD-10-CM | POA: Diagnosis not present

## 2022-08-26 ENCOUNTER — Ambulatory Visit (INDEPENDENT_AMBULATORY_CARE_PROVIDER_SITE_OTHER): Payer: Medicare HMO | Admitting: Podiatry

## 2022-08-26 ENCOUNTER — Encounter: Payer: Self-pay | Admitting: Podiatry

## 2022-08-26 DIAGNOSIS — M79675 Pain in left toe(s): Secondary | ICD-10-CM | POA: Diagnosis not present

## 2022-08-26 DIAGNOSIS — B351 Tinea unguium: Secondary | ICD-10-CM

## 2022-08-26 DIAGNOSIS — M79674 Pain in right toe(s): Secondary | ICD-10-CM

## 2022-08-26 DIAGNOSIS — Q828 Other specified congenital malformations of skin: Secondary | ICD-10-CM | POA: Diagnosis not present

## 2022-08-26 NOTE — Progress Notes (Signed)
  Subjective:  Patient ID: Brenda Morton, female    DOB: Apr 30, 1950,   MRN: [id number]  Chief Complaint  Patient presents with   Nail Problem    Routine foot care    72 y.o. female presents for concern of thickened elongated and painful nails that are difficult to trim. Requesting to have them trimmed today. Relates burning and tingling in their feet. Patient is not diabetic.   PCP:  Louie Boston., MD    . Denies any other pedal complaints. Denies n/v/f/c.   Past Medical History:  Diagnosis Date   Glaucoma    Hypertension     Objective:  Physical Exam: Vascular: DP/PT pulses 2/4 bilateral. CFT <3 seconds. Normal hair growth on digits. No edema.  Skin. No lacerations or abrasions bilateral feet. Nails 1-5 bilateral thickened elongated and with subungual debris. Hyperkeratotic lesionsnoted sub first and fifth metatarsal on left.  Musculoskeletal: MMT 5/5 bilateral lower extremities in DF, PF, Inversion and Eversion. Deceased ROM in DF of ankle joint.  Neurological: Sensation intact to light touch.   Assessment:   1. Pain due to onychomycosis of toenails of both feet   2. Porokeratosis      Plan:  Patient was evaluated and treated and all questions answered. -ABN signed -Mechanically debrided all nails 1-5 bilateral using sterile nail nipper and filed with dremel without incident  -Hyperkeratotic tissue debrided as courtesy.  -Answered all patient questions -Patient to return  in 3 months for at risk foot care -Patient advised to call the office if any problems or questions arise in the meantime.   Louann Sjogren, DPM

## 2022-09-02 DIAGNOSIS — H25811 Combined forms of age-related cataract, right eye: Secondary | ICD-10-CM | POA: Diagnosis not present

## 2022-09-02 DIAGNOSIS — H401113 Primary open-angle glaucoma, right eye, severe stage: Secondary | ICD-10-CM | POA: Diagnosis not present

## 2022-09-02 DIAGNOSIS — H401122 Primary open-angle glaucoma, left eye, moderate stage: Secondary | ICD-10-CM | POA: Diagnosis not present

## 2022-09-16 DIAGNOSIS — H25811 Combined forms of age-related cataract, right eye: Secondary | ICD-10-CM | POA: Diagnosis not present

## 2022-09-16 DIAGNOSIS — H401113 Primary open-angle glaucoma, right eye, severe stage: Secondary | ICD-10-CM | POA: Diagnosis not present

## 2022-09-16 DIAGNOSIS — H401122 Primary open-angle glaucoma, left eye, moderate stage: Secondary | ICD-10-CM | POA: Diagnosis not present

## 2022-10-18 DIAGNOSIS — H25811 Combined forms of age-related cataract, right eye: Secondary | ICD-10-CM | POA: Diagnosis not present

## 2022-10-18 DIAGNOSIS — H401113 Primary open-angle glaucoma, right eye, severe stage: Secondary | ICD-10-CM | POA: Diagnosis not present

## 2022-10-18 DIAGNOSIS — H5010 Unspecified exotropia: Secondary | ICD-10-CM | POA: Diagnosis not present

## 2022-10-18 DIAGNOSIS — H402213 Chronic angle-closure glaucoma, right eye, severe stage: Secondary | ICD-10-CM | POA: Diagnosis not present

## 2022-10-18 DIAGNOSIS — H401122 Primary open-angle glaucoma, left eye, moderate stage: Secondary | ICD-10-CM | POA: Diagnosis not present

## 2022-10-18 DIAGNOSIS — Z961 Presence of intraocular lens: Secondary | ICD-10-CM | POA: Diagnosis not present

## 2022-10-18 DIAGNOSIS — H01009 Unspecified blepharitis unspecified eye, unspecified eyelid: Secondary | ICD-10-CM | POA: Diagnosis not present

## 2022-10-18 DIAGNOSIS — H0100B Unspecified blepharitis left eye, upper and lower eyelids: Secondary | ICD-10-CM | POA: Diagnosis not present

## 2022-10-18 DIAGNOSIS — H0100A Unspecified blepharitis right eye, upper and lower eyelids: Secondary | ICD-10-CM | POA: Diagnosis not present

## 2022-11-28 DIAGNOSIS — H25811 Combined forms of age-related cataract, right eye: Secondary | ICD-10-CM | POA: Diagnosis not present

## 2022-11-28 DIAGNOSIS — H0100B Unspecified blepharitis left eye, upper and lower eyelids: Secondary | ICD-10-CM | POA: Diagnosis not present

## 2022-11-28 DIAGNOSIS — H01009 Unspecified blepharitis unspecified eye, unspecified eyelid: Secondary | ICD-10-CM | POA: Diagnosis not present

## 2022-11-28 DIAGNOSIS — Z961 Presence of intraocular lens: Secondary | ICD-10-CM | POA: Diagnosis not present

## 2022-11-28 DIAGNOSIS — H0100A Unspecified blepharitis right eye, upper and lower eyelids: Secondary | ICD-10-CM | POA: Diagnosis not present

## 2022-11-28 DIAGNOSIS — H401122 Primary open-angle glaucoma, left eye, moderate stage: Secondary | ICD-10-CM | POA: Diagnosis not present

## 2022-11-28 DIAGNOSIS — H402213 Chronic angle-closure glaucoma, right eye, severe stage: Secondary | ICD-10-CM | POA: Diagnosis not present

## 2022-12-02 DIAGNOSIS — Z23 Encounter for immunization: Secondary | ICD-10-CM | POA: Diagnosis not present

## 2022-12-17 DIAGNOSIS — Z6841 Body Mass Index (BMI) 40.0 and over, adult: Secondary | ICD-10-CM | POA: Diagnosis not present

## 2022-12-17 DIAGNOSIS — H25811 Combined forms of age-related cataract, right eye: Secondary | ICD-10-CM | POA: Diagnosis not present

## 2022-12-17 DIAGNOSIS — H402213 Chronic angle-closure glaucoma, right eye, severe stage: Secondary | ICD-10-CM | POA: Diagnosis not present

## 2022-12-19 DIAGNOSIS — H25811 Combined forms of age-related cataract, right eye: Secondary | ICD-10-CM | POA: Diagnosis not present

## 2022-12-23 ENCOUNTER — Ambulatory Visit (INDEPENDENT_AMBULATORY_CARE_PROVIDER_SITE_OTHER): Payer: Medicare HMO | Admitting: Podiatry

## 2022-12-23 DIAGNOSIS — Z91199 Patient's noncompliance with other medical treatment and regimen due to unspecified reason: Secondary | ICD-10-CM

## 2022-12-23 NOTE — Progress Notes (Signed)
No show

## 2022-12-25 DIAGNOSIS — H25811 Combined forms of age-related cataract, right eye: Secondary | ICD-10-CM | POA: Diagnosis not present

## 2022-12-25 DIAGNOSIS — H401112 Primary open-angle glaucoma, right eye, moderate stage: Secondary | ICD-10-CM | POA: Diagnosis not present

## 2022-12-25 DIAGNOSIS — H401122 Primary open-angle glaucoma, left eye, moderate stage: Secondary | ICD-10-CM | POA: Diagnosis not present

## 2022-12-26 DIAGNOSIS — Z9841 Cataract extraction status, right eye: Secondary | ICD-10-CM | POA: Diagnosis not present

## 2022-12-26 DIAGNOSIS — Z961 Presence of intraocular lens: Secondary | ICD-10-CM | POA: Diagnosis not present

## 2022-12-26 DIAGNOSIS — H01009 Unspecified blepharitis unspecified eye, unspecified eyelid: Secondary | ICD-10-CM | POA: Diagnosis not present

## 2022-12-26 DIAGNOSIS — H401122 Primary open-angle glaucoma, left eye, moderate stage: Secondary | ICD-10-CM | POA: Diagnosis not present

## 2022-12-26 DIAGNOSIS — H402213 Chronic angle-closure glaucoma, right eye, severe stage: Secondary | ICD-10-CM | POA: Diagnosis not present

## 2023-01-02 DIAGNOSIS — H401122 Primary open-angle glaucoma, left eye, moderate stage: Secondary | ICD-10-CM | POA: Diagnosis not present

## 2023-01-02 DIAGNOSIS — H402213 Chronic angle-closure glaucoma, right eye, severe stage: Secondary | ICD-10-CM | POA: Diagnosis not present

## 2023-01-02 DIAGNOSIS — Z9841 Cataract extraction status, right eye: Secondary | ICD-10-CM | POA: Diagnosis not present

## 2023-01-02 DIAGNOSIS — Z961 Presence of intraocular lens: Secondary | ICD-10-CM | POA: Diagnosis not present

## 2023-01-02 DIAGNOSIS — H01009 Unspecified blepharitis unspecified eye, unspecified eyelid: Secondary | ICD-10-CM | POA: Diagnosis not present

## 2023-01-30 DIAGNOSIS — H401122 Primary open-angle glaucoma, left eye, moderate stage: Secondary | ICD-10-CM | POA: Diagnosis not present

## 2023-01-30 DIAGNOSIS — H01009 Unspecified blepharitis unspecified eye, unspecified eyelid: Secondary | ICD-10-CM | POA: Diagnosis not present

## 2023-01-30 DIAGNOSIS — Z9841 Cataract extraction status, right eye: Secondary | ICD-10-CM | POA: Diagnosis not present

## 2023-01-30 DIAGNOSIS — H402213 Chronic angle-closure glaucoma, right eye, severe stage: Secondary | ICD-10-CM | POA: Diagnosis not present

## 2023-01-30 DIAGNOSIS — Z961 Presence of intraocular lens: Secondary | ICD-10-CM | POA: Diagnosis not present

## 2023-03-10 DIAGNOSIS — H25811 Combined forms of age-related cataract, right eye: Secondary | ICD-10-CM | POA: Diagnosis not present

## 2023-03-10 DIAGNOSIS — H401122 Primary open-angle glaucoma, left eye, moderate stage: Secondary | ICD-10-CM | POA: Diagnosis not present

## 2023-03-10 DIAGNOSIS — H401113 Primary open-angle glaucoma, right eye, severe stage: Secondary | ICD-10-CM | POA: Diagnosis not present

## 2023-03-10 DIAGNOSIS — H01001 Unspecified blepharitis right upper eyelid: Secondary | ICD-10-CM | POA: Diagnosis not present

## 2023-03-15 DIAGNOSIS — Z0100 Encounter for examination of eyes and vision without abnormal findings: Secondary | ICD-10-CM | POA: Diagnosis not present

## 2023-03-31 DIAGNOSIS — R5383 Other fatigue: Secondary | ICD-10-CM | POA: Diagnosis not present

## 2023-03-31 DIAGNOSIS — Z1329 Encounter for screening for other suspected endocrine disorder: Secondary | ICD-10-CM | POA: Diagnosis not present

## 2023-03-31 DIAGNOSIS — Z6833 Body mass index (BMI) 33.0-33.9, adult: Secondary | ICD-10-CM | POA: Diagnosis not present

## 2023-04-05 DIAGNOSIS — J9811 Atelectasis: Secondary | ICD-10-CM | POA: Diagnosis not present

## 2023-04-05 DIAGNOSIS — R079 Chest pain, unspecified: Secondary | ICD-10-CM | POA: Diagnosis not present

## 2023-04-05 DIAGNOSIS — I10 Essential (primary) hypertension: Secondary | ICD-10-CM | POA: Diagnosis not present

## 2023-04-05 DIAGNOSIS — H409 Unspecified glaucoma: Secondary | ICD-10-CM | POA: Diagnosis not present

## 2023-04-05 DIAGNOSIS — Z96651 Presence of right artificial knee joint: Secondary | ICD-10-CM | POA: Diagnosis not present

## 2023-04-05 DIAGNOSIS — M545 Low back pain, unspecified: Secondary | ICD-10-CM | POA: Diagnosis not present

## 2023-04-05 DIAGNOSIS — R110 Nausea: Secondary | ICD-10-CM | POA: Diagnosis not present

## 2023-04-05 DIAGNOSIS — N390 Urinary tract infection, site not specified: Secondary | ICD-10-CM | POA: Diagnosis not present

## 2023-04-05 DIAGNOSIS — R42 Dizziness and giddiness: Secondary | ICD-10-CM | POA: Diagnosis not present

## 2023-04-05 DIAGNOSIS — R9389 Abnormal findings on diagnostic imaging of other specified body structures: Secondary | ICD-10-CM | POA: Diagnosis not present

## 2023-04-05 DIAGNOSIS — R0989 Other specified symptoms and signs involving the circulatory and respiratory systems: Secondary | ICD-10-CM | POA: Diagnosis not present

## 2023-04-05 DIAGNOSIS — E785 Hyperlipidemia, unspecified: Secondary | ICD-10-CM | POA: Diagnosis not present

## 2023-04-28 DIAGNOSIS — Z1231 Encounter for screening mammogram for malignant neoplasm of breast: Secondary | ICD-10-CM | POA: Diagnosis not present

## 2023-05-13 DIAGNOSIS — Z6833 Body mass index (BMI) 33.0-33.9, adult: Secondary | ICD-10-CM | POA: Diagnosis not present

## 2023-05-13 DIAGNOSIS — M545 Low back pain, unspecified: Secondary | ICD-10-CM | POA: Diagnosis not present

## 2023-05-13 DIAGNOSIS — M25511 Pain in right shoulder: Secondary | ICD-10-CM | POA: Diagnosis not present

## 2023-06-05 DIAGNOSIS — R739 Hyperglycemia, unspecified: Secondary | ICD-10-CM | POA: Diagnosis not present

## 2023-06-05 DIAGNOSIS — Z13220 Encounter for screening for lipoid disorders: Secondary | ICD-10-CM | POA: Diagnosis not present

## 2023-06-05 DIAGNOSIS — Z1329 Encounter for screening for other suspected endocrine disorder: Secondary | ICD-10-CM | POA: Diagnosis not present

## 2023-06-05 DIAGNOSIS — Z1321 Encounter for screening for nutritional disorder: Secondary | ICD-10-CM | POA: Diagnosis not present

## 2023-06-05 DIAGNOSIS — E875 Hyperkalemia: Secondary | ICD-10-CM | POA: Diagnosis not present

## 2023-06-05 DIAGNOSIS — Z0001 Encounter for general adult medical examination with abnormal findings: Secondary | ICD-10-CM | POA: Diagnosis not present

## 2023-06-16 DIAGNOSIS — Z6833 Body mass index (BMI) 33.0-33.9, adult: Secondary | ICD-10-CM | POA: Diagnosis not present

## 2023-06-16 DIAGNOSIS — E782 Mixed hyperlipidemia: Secondary | ICD-10-CM | POA: Diagnosis not present

## 2023-06-16 DIAGNOSIS — Z1389 Encounter for screening for other disorder: Secondary | ICD-10-CM | POA: Diagnosis not present

## 2023-06-16 DIAGNOSIS — M810 Age-related osteoporosis without current pathological fracture: Secondary | ICD-10-CM | POA: Diagnosis not present

## 2023-06-16 DIAGNOSIS — Z1211 Encounter for screening for malignant neoplasm of colon: Secondary | ICD-10-CM | POA: Diagnosis not present

## 2023-06-16 DIAGNOSIS — Z1331 Encounter for screening for depression: Secondary | ICD-10-CM | POA: Diagnosis not present

## 2023-06-16 DIAGNOSIS — I10 Essential (primary) hypertension: Secondary | ICD-10-CM | POA: Diagnosis not present

## 2023-06-16 DIAGNOSIS — Z0001 Encounter for general adult medical examination with abnormal findings: Secondary | ICD-10-CM | POA: Diagnosis not present

## 2023-06-16 DIAGNOSIS — E7849 Other hyperlipidemia: Secondary | ICD-10-CM | POA: Diagnosis not present

## 2023-06-26 DIAGNOSIS — H401122 Primary open-angle glaucoma, left eye, moderate stage: Secondary | ICD-10-CM | POA: Diagnosis not present

## 2023-06-26 DIAGNOSIS — H01005 Unspecified blepharitis left lower eyelid: Secondary | ICD-10-CM | POA: Diagnosis not present

## 2023-06-26 DIAGNOSIS — H01004 Unspecified blepharitis left upper eyelid: Secondary | ICD-10-CM | POA: Diagnosis not present

## 2023-06-26 DIAGNOSIS — H01001 Unspecified blepharitis right upper eyelid: Secondary | ICD-10-CM | POA: Diagnosis not present

## 2023-06-26 DIAGNOSIS — Z961 Presence of intraocular lens: Secondary | ICD-10-CM | POA: Diagnosis not present

## 2023-06-26 DIAGNOSIS — H401113 Primary open-angle glaucoma, right eye, severe stage: Secondary | ICD-10-CM | POA: Diagnosis not present

## 2023-06-26 DIAGNOSIS — H01002 Unspecified blepharitis right lower eyelid: Secondary | ICD-10-CM | POA: Diagnosis not present

## 2023-06-26 DIAGNOSIS — H26491 Other secondary cataract, right eye: Secondary | ICD-10-CM | POA: Diagnosis not present

## 2023-10-06 DIAGNOSIS — H401113 Primary open-angle glaucoma, right eye, severe stage: Secondary | ICD-10-CM | POA: Diagnosis not present

## 2023-10-06 DIAGNOSIS — H401122 Primary open-angle glaucoma, left eye, moderate stage: Secondary | ICD-10-CM | POA: Diagnosis not present

## 2023-10-06 DIAGNOSIS — Z961 Presence of intraocular lens: Secondary | ICD-10-CM | POA: Diagnosis not present

## 2023-10-06 DIAGNOSIS — H01001 Unspecified blepharitis right upper eyelid: Secondary | ICD-10-CM | POA: Diagnosis not present

## 2023-10-16 DIAGNOSIS — Z6833 Body mass index (BMI) 33.0-33.9, adult: Secondary | ICD-10-CM | POA: Diagnosis not present

## 2023-10-16 DIAGNOSIS — M79661 Pain in right lower leg: Secondary | ICD-10-CM | POA: Diagnosis not present

## 2023-11-27 DIAGNOSIS — E875 Hyperkalemia: Secondary | ICD-10-CM | POA: Diagnosis not present

## 2023-11-27 DIAGNOSIS — E7849 Other hyperlipidemia: Secondary | ICD-10-CM | POA: Diagnosis not present

## 2023-12-01 DIAGNOSIS — M810 Age-related osteoporosis without current pathological fracture: Secondary | ICD-10-CM | POA: Diagnosis not present

## 2023-12-01 DIAGNOSIS — E7849 Other hyperlipidemia: Secondary | ICD-10-CM | POA: Diagnosis not present

## 2023-12-01 DIAGNOSIS — Z23 Encounter for immunization: Secondary | ICD-10-CM | POA: Diagnosis not present

## 2023-12-01 DIAGNOSIS — I10 Essential (primary) hypertension: Secondary | ICD-10-CM | POA: Diagnosis not present

## 2023-12-01 DIAGNOSIS — Z6830 Body mass index (BMI) 30.0-30.9, adult: Secondary | ICD-10-CM | POA: Diagnosis not present
